# Patient Record
Sex: Male | Born: 1965 | Race: White | Hispanic: No | Marital: Married | Smoking: Current every day smoker
Health system: Southern US, Community
[De-identification: ages and names within clinical notes are randomized; demographics above are authoritative.]

---

## 2015-07-12 DIAGNOSIS — M170 Bilateral primary osteoarthritis of knee: Secondary | ICD-10-CM | POA: Insufficient documentation

## 2016-05-15 ENCOUNTER — Other Ambulatory Visit: Payer: Self-pay | Admitting: Neurology

## 2016-05-15 DIAGNOSIS — M542 Cervicalgia: Secondary | ICD-10-CM

## 2016-06-06 ENCOUNTER — Ambulatory Visit: Admission: RE | Admit: 2016-06-06 | Payer: Managed Care, Other (non HMO) | Source: Ambulatory Visit

## 2016-07-18 ENCOUNTER — Ambulatory Visit: Payer: Managed Care, Other (non HMO)

## 2016-07-21 ENCOUNTER — Ambulatory Visit: Payer: Managed Care, Other (non HMO)

## 2016-08-18 ENCOUNTER — Ambulatory Visit
Admission: RE | Admit: 2016-08-18 | Discharge: 2016-08-18 | Disposition: A | Discharge status: Home / Self Care | Payer: Managed Care, Other (non HMO) | Source: Ambulatory Visit | Attending: Neurology | Admitting: Neurology

## 2016-08-18 DIAGNOSIS — M542 Cervicalgia: Secondary | ICD-10-CM

## 2016-08-18 MED ORDER — GADOBENATE DIMEGLUMINE 529 MG/ML IV SOLN
16.0000 mL | Freq: Once | INTRAVENOUS | Status: AC | PRN
  Administered 2016-08-18: 16 mL via INTRAVENOUS

## 2017-07-17 ENCOUNTER — Ambulatory Visit
Admission: RE | Admit: 2017-07-17 | Discharge: 2017-07-17 | Disposition: A | Discharge status: Home / Self Care | Payer: Managed Care, Other (non HMO) | Source: Ambulatory Visit | Attending: Student in an Organized Health Care Education/Training Program | Admitting: Student in an Organized Health Care Education/Training Program

## 2017-07-17 ENCOUNTER — Ambulatory Visit
Payer: Managed Care, Other (non HMO) | Attending: Student in an Organized Health Care Education/Training Program | Admitting: Student in an Organized Health Care Education/Training Program

## 2017-07-17 ENCOUNTER — Encounter: Payer: Self-pay | Admitting: Student in an Organized Health Care Education/Training Program

## 2017-07-17 VITALS — BP 129/79 | HR 78 | Temp 97.9°F | Resp 16 | Ht 69.0 in | Wt 173.0 lb

## 2017-07-17 DIAGNOSIS — G43909 Migraine, unspecified, not intractable, without status migrainosus: Secondary | ICD-10-CM | POA: Insufficient documentation

## 2017-07-17 DIAGNOSIS — M1712 Unilateral primary osteoarthritis, left knee: Secondary | ICD-10-CM | POA: Insufficient documentation

## 2017-07-17 DIAGNOSIS — Z7982 Long term (current) use of aspirin: Secondary | ICD-10-CM | POA: Diagnosis not present

## 2017-07-17 DIAGNOSIS — G43709 Chronic migraine without aura, not intractable, without status migrainosus: Secondary | ICD-10-CM | POA: Diagnosis not present

## 2017-07-17 DIAGNOSIS — M170 Bilateral primary osteoarthritis of knee: Secondary | ICD-10-CM | POA: Diagnosis not present

## 2017-07-17 DIAGNOSIS — F17210 Nicotine dependence, cigarettes, uncomplicated: Secondary | ICD-10-CM | POA: Diagnosis not present

## 2017-07-17 DIAGNOSIS — M4722 Other spondylosis with radiculopathy, cervical region: Secondary | ICD-10-CM | POA: Diagnosis not present

## 2017-07-17 DIAGNOSIS — Z87442 Personal history of urinary calculi: Secondary | ICD-10-CM | POA: Insufficient documentation

## 2017-07-17 DIAGNOSIS — M7918 Myalgia, other site: Secondary | ICD-10-CM | POA: Insufficient documentation

## 2017-07-17 DIAGNOSIS — M47812 Spondylosis without myelopathy or radiculopathy, cervical region: Secondary | ICD-10-CM

## 2017-07-17 DIAGNOSIS — M5412 Radiculopathy, cervical region: Secondary | ICD-10-CM | POA: Diagnosis not present

## 2017-07-17 DIAGNOSIS — Z79899 Other long term (current) drug therapy: Secondary | ICD-10-CM | POA: Diagnosis not present

## 2017-07-17 DIAGNOSIS — M791 Myalgia: Secondary | ICD-10-CM | POA: Diagnosis not present

## 2017-07-17 MED ORDER — GABAPENTIN 300 MG PO CAPS: 600.0000 mg | ORAL_CAPSULE | Freq: Three times a day (TID) | ORAL | 2 refills | Status: AC

## 2017-07-17 NOTE — Patient Instructions (Signed)
-Increase Gabapentin dose to 600 mg three times a day. (2 capsules, 3 times a day). Rx provided -Bilateral Knee xrays -Follow up in 3-4 weeks to discuss Gabapentin dose increase and procedures for your neck and knees as we dicussed. For your neck we talked about LEFT cervical medial branch blocks (C3-C7) with goal radiofrequency ablation and for your knees we talked about genicular nerve blocks and hyalgen knee injections.   ____________________________________________________________________________________________  Appointment Policy Summary  It is our goal and responsibility to provide the medical community with assistance in the evaluation and management of patients with chronic pain. Unfortunately our resources are limited. Because we do not have an unlimited amount of time, or available appointments, we are required to closely monitor and manage their use. The following rules exist to maximize their use:  Patient's responsibilities: 1. Punctuality:  At what time should I arrive? You should be physically present in our office 30 minutes before your scheduled appointment. Your scheduled appointment is with your assigned healthcare provider. However, it takes 5-10 minutes to be "checked-in", and another 15 minutes for the nurses to do the admission. If you arrive to our office at the time you were given for your appointment, you will end up being at least 20-25 minutes late to your appointment with the provider. 2. Tardiness:  What happens if I arrive only a few minutes after my scheduled appointment time? You will need to reschedule your appointment. The cutoff is your appointment time. This is why it is so important that you arrive at least 30 minutes before that appointment. If you have an appointment scheduled for 10:00 AM and you arrive at 10:01, you will be required to reschedule your appointment.  3. Plan ahead:  Always assume that you will encounter traffic on your way in. Plan for it. If  you are dependent on a driver, make sure they understand these rules and the need to arrive early. 4. Other appointments and responsibilities:  Avoid scheduling any other appointments before or after your pain clinic appointments.  5. Be prepared:  Write down everything that you need to discuss with your healthcare provider and give this information to the admitting nurse. Write down the medications that you will need refilled. Bring your pills and bottles (even the empty ones), to all of your appointments, except for those where a procedure is scheduled. 6. No children or pets:  Find someone to take care of them. It is not appropriate to bring them in. 7. Scheduling changes:  We request "advanced notification" of any changes or cancellations. 8. Advanced notification:  Defined as a time period of more than 24 hours prior to the originally scheduled appointment. This allows for the appointment to be offered to other patients. 9. Rescheduling:  When a visit is rescheduled, it will require the cancellation of the original appointment. For this reason they both fall within the category of "Cancellations".  10. Cancellations:  They require advanced notification. Any cancellation less than 24 hours before the  appointment will be recorded as a "No Show". 11. No Show:  Defined as an unkept appointment where the patient failed to notify or declare to the practice their intention or inability to keep the appointment.  Corrective process for repeat offenders:  1. Tardiness: Three (3) episodes of rescheduling due to late arrivals will be recorded as one (1) "No Show". 2. Cancellation or reschedule: Three (3) cancellations or rescheduling will be recorded as one (1) "No Show". 3. "No Shows": Three (3) "No  Shows" within a 12 month period will result in discharge from the  practice.  ____________________________________________________________________________________________ ____________________________________________________________________________________________  Genicular Nerve Block  What is a genicular nerve block? A genicular nerve block is the injection of a local anesthetic to block the nerves that transmits pain from the knee.  What is the purpose of a facet nerve block? A genicular nerve block is a diagnostic procedure to determine if the pathologic changes (i.e. arthritis, meniscal tears, etc) and inflammation within the knee joint is the source of your knee pain. It also confirms that the knee pain will respond well to the actual treatment procedure. If a genicular nerve block works, it will give you relief for several hours. After that, the pain is expected to return to normal. This test is always performed twice (usually a week or two apart) because two successful tests are required to move onto treatment. If both diagnostic tests are positive, then we schedule a treatment called radiofrequency (RF) ablation. In this procedure, the same nerves are cauterized, which typically leads to pain relief for 4 -18 months. If this process works well for one knee, it can be performed on the other knee if needed.  How is the procedure performed? You will be placed on the procedure table. The injection site is sterilized with either iodine or chlorhexadine. The site to be injected is numbed with a local anesthetic, and a needle is directed to the target area. X-ray guidance is used to ensure proper placement and positioning of the needle. When the needle is properly positioned near the genicular nerve, local anesthetic is injected to numb that nerve. This will be repeated at multiple sites around the knee to block all genicular nerves.  Will the procedure be painful? The injection can be painful and we therefore provide the option of receiving IV sedation. IV  sedation, combined with local anesthetic, can make the injection nearly pain free. It allows you to remain very still during the procedure, which can also make the injection easier, faster, and more successful. If you decide to have IV sedation, you must have a driver to get you home safely afterwards. In addition, you cannot have anything to eat or drink within 8 hours of your appointment (clear liquids are allowed until 3 hours before the procedure). If you take medications for diabetes, these medications may need to be adjusted the morning of the procedure. Your primary care physician can help you with this adjustment.  What are the discharge instructions? If you received IV sedation do not drive or operate machinery for at least 24 hours after the procedure. You may return to work the next day following your procedure. You may resume your normal diet immediately. Do not engage in any strenuous activity for 24 hours. You should, however, engage in moderate activity that typically causes your ususal pain. If the block works, those activities should not be painful for several hours after the injection. Do not take a bath, swim, or use a hot tub for 24 hours (you may take a shower). Call the office if you have any of the following: severe pain afterwards (different than your usual symptoms), redness/swelling/discharge at the injection site(s), fevers/chills, difficulty with bowel or bladder functions.  What are the risks and side effects? The complication rate for this procedure is very low. Whenever a needle enters the skin, bleeding or infection can occur. Some other serious but extremely rare risks include paralysis and death. You may have an allergic reaction to any of  the medications used. If you have a known allergy to any medications, especially local anesthetics, notify our staff before the procedure takes place. You may experience any of the following side effects up to 4 - 6 hours after the  procedure: . Leg muscle weakness or numbness may occur due to the local anesthetic affecting the nerves that control your legs (this is a temporary affect and it is not paralysis). If you have any leg weakness or numbness, walk only with assistance in order to prevent falls and injury. Your leg strength will return slowly and completely. . Dizziness may occur due to a decrease in your blood pressure. If this occurs, remain in a seated or lying position. Gradually sit up, and then stand after at least 10 minutes of sitting. . Mild headaches may occur. Drink fluids and take pain medications if needed. If the headaches persist or become severe, call the office. . Mild discomfort at the injection site can occur. This typically lasts for a few hours but can persist for a couple days. If this occurs, take anti-inflammatories or pain medications, apply ice to the area the day of the procedure. If it persists, apply moist heat in the day(s) following.  The side effects listed above can be normal. They are not dangerous and will resolve on their own. If, however, you experience any of the following, a complication may have occurred and you should either contact your doctor. If he is not readily available, then you should proceed to the closest urgent care center for evaluation: . Severe or progressive pain at the injection site(s) . Arm or leg weakness that progressively worsens or persists for longer than 8 hours . Severe or progressive redness, swelling, or discharge from the injections site(s) . Fevers, chills, nausea, or vomiting . Bowel or bladder dysfunction (i.e. inability to urinate or pass stool or difficulty controlling either)  How long does it take for the procedure to work? You should feel relief from your usual pain within the first hour. Again, this is only expected to last for several hours, at the most. Remember, you may be sore in the middle part of your back from the needles, and you must  distinguish this from your usual pain. ____________________________________________________________________________________________

## 2017-07-17 NOTE — Progress Notes (Signed)
Patient's Name: Andrew Keith  MRN: [id number]  Referring Provider: Vladimir Crofts, MD  DOB: 01-25-1966  PCP: [facility]  DOS: 07/17/2017  Note by: Gillis Santa, MD  Service setting: Ambulatory outpatient  Specialty: Interventional Pain Management  Location: [facility] (AMB) Pain Management Facility  Visit type: Initial Patient Evaluation  Patient type: New Patient   Primary Reason(s) for Visit: Encounter for initial evaluation of one or more chronic problems (new to examiner) potentially causing chronic pain, and posing a threat to normal musculoskeletal function. (Level of risk: High) CC: Neck Pain (bilateral) and Knee Pain (bilateral)  HPI  Andrew Keith is a 51 y.o. year old, male patient, who comes today to see [place] for the first time for an initial evaluation of his chronic pain. He has Primary osteoarthritis of both knees; Cervical radiculopathy; Cervical spondylosis without myelopathy; Myofascial pain; and Migraines on his problem list. Today he comes in for evaluation of his Neck Pain (bilateral) and Knee Pain (bilateral)  Pain Assessment: Location: Left, Right (bilateral) Neck (knees) also endorses headaches that are migraines in nature Radiating: down the arms into hands and fingers,  knee pain stays mainly in the knees Onset: More than a month ago Duration: Chronic pain Quality: Numbness, Tingling, Dull, Discomfort, Constant, Shooting, Aching Severity: 4 /10 (self-reported pain score)  Note: Reported level is compatible with observation.                   Effect on ADL: has affected his grip.  slows him down a little bit but tries to continue with work and daily activities.  end of day is difficult with increased pain. Timing: Constant Modifying factors: ibuprofen helps minimally.    Onset and Duration: Gradual and Present longer than 3 months Cause of pain: Unknown Severity: Getting worse, NAS-11 at its worse: 10/10, NAS-11 at its best: 3/10, NAS-11 now: 4/10 and NAS-11  on the average: 4/10 Timing: Morning, Afternoon, Night, After activity or exercise and After a period of immobility Aggravating Factors: Bending, Kneeling, Prolonged sitting, Squatting and Working Alleviating Factors: nothing Associated Problems: Dizziness, Numbness, Personality changes, Tingling and Pain that wakes patient up Quality of Pain: Aching, Agonizing and Shooting Previous Examinations or Tests: CT scan and MRI scan Previous Treatments: The patient denies treatment  The patient comes into the clinics today for the first time for a chronic pain management evaluation.   Today I took the time to provide the patient with information regarding my pain practice. The patient was informed that my practice is divided into two sections: an interventional pain management section, as well as a completely separate and distinct medication management section. I explained that I have procedure days for my interventional therapies, and evaluation days for follow-ups and medication management. Because of the amount of documentation required during both, they are kept separated. This means that there is the possibility that he may be scheduled for a procedure on one day, and medication management the next. I have also informed him that because of staffing and facility limitations, I no longer take patients for medication management only.  Because interventional pain management is my board-certified specialty, the patient was informed that joining my practice means that they are open to any and all interventional therapies. I made it clear that this does not mean that they will be forced to have any procedures done. What this means is that I believe interventional therapies to be essential part of the diagnosis and proper management of chronic  pain conditions. Therefore, patients not interested in these interventional alternatives will be better served under the care of a different practitioner.  The patient  was also made aware of my Comprehensive Pain Management Safety Guidelines where by joining my practice, they limit all of their nerve blocks and joint injections to those done by our practice, for as long as we are retained to manage their care.   Historic Controlled Substance Pharmacotherapy Review  PMP and historical list of controlled substances: Last prescription for a controlled substance was on 03/31/2014 for Percocet quantity 30.  Medications: The patient did not bring the medication(s) to the appointment, as requested in our "New Patient Package" Pharmacodynamics: Desired effects: Analgesia: The patient reports >50% benefit. Reported improvement in function: The patient reports medication allows him to accomplish basic ADLs. Clinically meaningful improvement in function (CMIF): Sustained CMIF goals met Perceived effectiveness: Described as relatively effective, allowing for increase in activities of daily living (ADL) Undesirable effects: Side-effects or Adverse reactions: None reported Historical Monitoring: The patient  reports that he does not use drugs. List of all UDS Test(s): No results found for: MDMA, COCAINSCRNUR, PCPSCRNUR, PCPQUANT, CANNABQUANT, THCU, Crystal List of all Serum Drug Screening Test(s):  No results found for: AMPHSCRSER, BARBSCRSER, BENZOSCRSER, COCAINSCRSER, PCPSCRSER, PCPQUANT, THCSCRSER, CANNABQUANT, OPIATESCRSER, OXYSCRSER, PROPOXSCRSER Historical Background Evaluation: [place] PDMP: Six (6) year initial data search conducted.             [place] Department of public safety, offender search: Editor, commissioning Information) Non-contributory Risk Assessment Profile: Aberrant behavior: None observed or detected today Risk factors for fatal opioid overdose: age 31-11 years old, history of substance abuse and male gender Fatal overdose hazard ratio (HR): Calculation deferred Non-fatal overdose hazard ratio (HR): Calculation deferred Risk of opioid abuse or dependence: 0.7-3.0% with  doses ? 36 MME/day and 6.1-26% with doses ? 120 MME/day. Substance use disorder (SUD) risk level: Pending results of Medical Psychology Evaluation for SUD Opioid risk tool (ORT) (Total Score): 7  ORT Scoring interpretation table:  Score <3 = Low Risk for SUD  Score between 4-7 = Moderate Risk for SUD  Score >8 = High Risk for Opioid Abuse   PHQ-2 Depression Scale:  Total score: 0  PHQ-2 Scoring interpretation table: (Score and probability of major depressive disorder)  Score 0 = No depression  Score 1 = 15.4% Probability  Score 2 = 21.1% Probability  Score 3 = 38.4% Probability  Score 4 = 45.5% Probability  Score 5 = 56.4% Probability  Score 6 = 78.6% Probability   PHQ-9 Depression Scale:  Total score: 0  PHQ-9 Scoring interpretation table:  Score 0-4 = No depression  Score 5-9 = Mild depression  Score 10-14 = Moderate depression  Score 15-19 = Moderately severe depression  Score 20-27 = Severe depression (2.4 times higher risk of SUD and 2.89 times higher risk of overuse)   Pharmacologic Plan: Adjust therapy: increase Gabapentin to 600 mg TID (from 300 mg BID)            Initial impression: Poor candidate for opioid analgesics. Hx of cocaine abuse in the remote past, +MJ use.  Meds   Current Meds  Medication Sig  . Aspirin-Caffeine (BC FAST PAIN RELIEF ARTHRITIS PO) Take 2 Packages by mouth daily.  Marland Kitchen gabapentin (NEURONTIN) 300 MG capsule Take 2 capsules (600 mg total) by mouth 3 (three) times daily.  Marland Kitchen ibuprofen (ADVIL,MOTRIN) 800 MG tablet Take 800 mg by mouth every 8 (eight) hours as needed (patient usually takes only once  per week.).  . [DISCONTINUED] gabapentin (NEURONTIN) 300 MG capsule Take 1 capsule by mouth 2 (two) times daily.    Imaging Review  Cervical Imaging: Cervical MR w/wo contrast:  Results for orders placed during the hospital encounter of 08/18/16  MR Cervical Spine W Wo Contrast   Narrative CLINICAL DATA:  Chronic neck pain. No known injury.  Initial encounter.  EXAM: MRI CERVICAL SPINE WITHOUT AND WITH CONTRAST  TECHNIQUE: Multiplanar and multiecho pulse sequences of the cervical spine, to include the craniocervical junction and cervicothoracic junction, were obtained according to standard protocol without and with intravenous contrast.  CONTRAST:  16 ml MULTIHANCE GADOBENATE DIMEGLUMINE 529 MG/ML IV SOLN  COMPARISON:  None.  FINDINGS: Vertebral body height and alignment are maintained. No worrisome marrow lesion is seen. Degenerative endplate signal change and associated postcontrast enhancement are seen at C6-7 and C7-T1 eccentric to the right. Small hemangioma in T2 is incidentally noted. The craniocervical junction and cervical cord signal are normal. Imaged paraspinous structures are unremarkable.  C2-3: Mild to moderate facet degenerative change is more notable on the left. No bulge or protrusion. The central canal and foramina are open.  C3-4:  Mild facet degenerative disease.  Otherwise negative.  C4-5: Very shallow disc bulge is seen and there is uncovertebral disease, more notable on the left. Facet degenerative disease is also worse on the left. The central canal is open. Mild to moderate right and moderately severe left foraminal narrowing is identified.  C5-6: Very shallow disc bulge and mild uncovertebral disease. The central canal and foramina are open.  C6-7: Very shallow disc bulge is identified and there is left worse than right uncovertebral disease. The central canal is patent. Mild right and moderately severe left foraminal narrowing is identified.  C7-T1: There is some uncovertebral spurring which is more notable on the right. The central canal and right foramen are widely patent. Moderate right foraminal narrowing is identified.  IMPRESSION: Negative for central canal stenosis. Foraminal narrowing is most notable on the left at C4-5 and C6-7 where it appears moderately severe.  Please see above for descriptions of individual levels.   Electronically Signed   By: Inge Rise M.D.   On: 08/18/2016 10:50     Complexity Note: Imaging results reviewed. Results discussed using Layman's terms.               ROS  Cardiovascular History: No reported cardiovascular signs or symptoms such as High blood pressure, coronary artery disease, abnormal heart rate or rhythm, heart attack, blood thinner therapy or heart weakness and/or failure Pulmonary or Respiratory History: No reported pulmonary signs or symptoms such as wheezing and difficulty taking a deep full breath (Asthma), difficulty blowing air out (Emphysema), coughing up mucus (Bronchitis), persistent dry cough, or temporary stoppage of breathing during sleep Neurological History: No reported neurological signs or symptoms such as seizures, abnormal skin sensations, urinary and/or fecal incontinence, being born with an abnormal open spine and/or a tethered spinal cord Review of Past Neurological Studies: No results found for this or any previous visit. Psychological-Psychiatric History: Psychiatric disorder Gastrointestinal History: No reported gastrointestinal signs or symptoms such as vomiting or evacuating blood, reflux, heartburn, alternating episodes of diarrhea and constipation, inflamed or scarred liver, or pancreas or irrregular and/or infrequent bowel movements Genitourinary History: No reported renal or genitourinary signs or symptoms such as difficulty voiding or producing urine, peeing blood, non-functioning kidney, kidney stones, difficulty emptying the bladder, difficulty controlling the flow of urine, or chronic kidney disease Hematological  History: No reported hematological signs or symptoms such as prolonged bleeding, low or poor functioning platelets, bruising or bleeding easily, hereditary bleeding problems, low energy levels due to low hemoglobin or being anemic Endocrine History: No reported endocrine  signs or symptoms such as high or low blood sugar, rapid heart rate due to high thyroid levels, obesity or weight gain due to slow thyroid or thyroid disease Rheumatologic History: Joint aches and or swelling due to excess weight (Osteoarthritis) Musculoskeletal History: Negative for myasthenia gravis, muscular dystrophy, multiple sclerosis or malignant hyperthermia Work History: Working full time  Allergies  Mr. Nabers has No Known Allergies.  Laboratory Chemistry  Inflammation Markers (CRP: Acute Phase) (ESR: Chronic Phase) No results found for: CRP, ESRSEDRATE               Renal Function Markers No results found for: BUN, CREATININE, GFRAA, GFRNONAA               Hepatic Function Markers No results found for: AST, ALT, ALBUMIN, ALKPHOS, HCVAB               Electrolytes No results found for: NA, K, CL, CALCIUM, MG               Neuropathy Markers No results found for: [id number]               Bone Pathology Markers No results found for: Hendricks Milo, VD125OH2TOT, [id number], [id number], 25OHVITD1, 25OHVITD2, 25OHVITD3, CALCIUM, TESTOFREE, TESTOSTERONE               Coagulation Parameters No results found for: INR, LABPROT, APTT, PLT               Cardiovascular Markers No results found for: BNP, HGB, HCT               Note: Lab results reviewed.  PFSH  Drug: Mr. Burrows  reports that he does not use drugs. remote history of cocaine abuse, + MJ use Alcohol:  reports that he drinks alcohol. Tobacco:  reports that he has been smoking.  He has been smoking about 0.50 packs per day. He has never used smokeless tobacco. Medical:  has no past medical history on file. Family: family history is not on file.  No past surgical history on file. Active Ambulatory Problems    Diagnosis Date Noted  . Primary osteoarthritis of both knees 07/12/2015  . Cervical radiculopathy 07/17/2017  . Cervical spondylosis without myelopathy 07/17/2017  . Myofascial pain 07/17/2017  . Migraines  07/17/2017   Resolved Ambulatory Problems    Diagnosis Date Noted  . No Resolved Ambulatory Problems   No Additional Past Medical History   Constitutional Exam  General appearance: Well nourished, well developed, and well hydrated. In no apparent acute distress Vitals:   07/17/17 1348  BP: 129/79  Pulse: 78  Resp: 16  Temp: 97.9 F (36.6 C)  TempSrc: Oral  SpO2: 100%  Weight: 173 lb (78.5 kg)  Height: 5' 9"  (1.753 m)   BMI Assessment: Estimated body mass index is 25.55 kg/m as calculated from the following:   Height as of this encounter: 5' 9"  (1.753 m).   Weight as of this encounter: 173 lb (78.5 kg).  BMI interpretation table: BMI level Category Range association with higher incidence of chronic pain  <18 kg/m2 Underweight   18.5-24.9 kg/m2 Ideal body weight   25-29.9 kg/m2 Overweight Increased incidence by 20%  30-34.9 kg/m2 Obese (Class I) Increased incidence by 68%  35-39.9 kg/m2  Severe obesity (Class II) Increased incidence by 136%  >40 kg/m2 Extreme obesity (Class III) Increased incidence by 254%   BMI Readings from Last 4 Encounters:  07/17/17 25.55 kg/m   Wt Readings from Last 4 Encounters:  07/17/17 173 lb (78.5 kg)  Psych/Mental status: Alert, oriented x 3 (person, place, & time)       Eyes: PERLA Respiratory: No evidence of acute respiratory distress  Cervical Spine Exam  Inspection: No masses, redness, or swelling Alignment: Symmetrical Functional ROM: Pain restricted ROM     pain with left head rotation and extension Stability: No instability detected Muscle strength & Tone: Functionally intact Sensory: Movement-associated pain and discomfort Palpation: Complains of area being tender to palpation along the upper cervical facets, L>R              Upper Extremity (UE) Exam    Side: Right upper extremity  Side: Left upper extremity  Inspection: No masses, redness, swelling, or asymmetry. No contractures  Inspection: No masses, redness, swelling, or  asymmetry. No contractures  Functional ROM: Unrestricted ROM          Functional ROM: Unrestricted ROM          Muscle strength & Tone: Normal strength (5/5)  Muscle strength & Tone: Normal strength (5/5)  Sensory: Unimpaired  Sensory: Unimpaired  Palpation: No palpable anomalies              Palpation: No palpable anomalies              Specialized Test(s): Deferred         Specialized Test(s): Deferred          Thoracic Spine Exam  Inspection: No masses, redness, or swelling Alignment: Symmetrical Functional ROM: Unrestricted ROM Stability: No instability detected Sensory: Unimpaired Muscle strength & Tone: No palpable anomalies  Lumbar Spine Exam  Inspection: No masses, redness, or swelling Alignment: Symmetrical Functional ROM: Unrestricted ROM      Stability: No instability detected Muscle strength & Tone: Functionally intact Sensory: Unimpaired Palpation: No palpable anomalies       Provocative Tests: Lumbar Hyperextension and rotation test: Negative       Lumbar Lateral bending test: Negative       Patrick's Maneuver: Negative                    Gait & Posture Assessment  Ambulation: Unassisted Gait: Relatively normal for age and body habitus Posture: WNL   Lower Extremity Exam    Side: Right lower extremity  Side: Left lower extremity  Inspection: No masses, redness, swelling, or asymmetry. No contractures  Inspection: No masses, redness, swelling, or asymmetry. No contractures  Functional ROM: Unrestricted ROM          Functional ROM: Unrestricted ROM          Muscle strength & Tone: Functionally intact  Muscle strength & Tone: Functionally intact  Sensory: Unimpaired  Sensory: Unimpaired  Palpation: Complains of area being tender to palpation (knee- along patella and tibial tuberosity)  Palpation: No palpable anomalies  (knee- along patella and tibial tuberosity)   Assessment  Primary Diagnosis & Pertinent Problem List: The primary encounter diagnosis was Primary  osteoarthritis of left knee. Diagnoses of Cervical radiculopathy, Cervical spondylosis without myelopathy, Myofascial pain, Chronic migraine without aura without status migrainosus, not intractable, and Primary osteoarthritis of both knees were also pertinent to this visit.  Visit Diagnosis (New problems to examiner): 1. Primary osteoarthritis of left knee   2.  Cervical radiculopathy   3. Cervical spondylosis without myelopathy   4. Myofascial pain   5. Chronic migraine without aura without status migrainosus, not intractable   6. Primary osteoarthritis of both knees    Plan of Care (Initial workup plan)  Note: Please be advised that as per protocol, today's visit has been an evaluation only. We have not taken over the patient's controlled substance management.  Patient is a 51 year old male with a past medical history consistent with migraine headaches, peripheral neuropathy, neck pain, cervicalgia. Patient does have a history of alcohol abuse and marijuana use in the past. Patient's neck pain and associated shoulder pain could also be secondary to cervical facet arthropathy, cervical spondylosis, chronic cervical radiculopathy. Patient's MRI from August 2017 shows left C4, C5 and C6, C7 neural foraminal narrowing. MRI negative for central canal stenosis.  Patient's headaches could be secondary to cervical myofascial pain syndrome as well as cervical facet arthropathy. Regarding the patient's neck pain, we discussed cervical medial branch nerve blocks with goal radiofrequency ablation, trigger point injections, Botox therapy.  Patient has bilateral knee pain as well likely secondary to knee osteoarthritis. Patient was told that he needs knee replacement surgery given his age, it is better that surgery be deferred and conservative management be undertaken. We discussed obtaining bilateral knee films and then considering either intra-articular Hyalgan injections versus genicular nerve blocks.  In  regards to medication therapy, patient is taking Gabapentin 300 mg BID and Ibuprofen 800 mg q8 prn which he finds moderately effective. Of note patient does have a history of cocaine useIn the 1990s but has not abused cocaine recently. Patient does endorse marijuana use, most recently as of the last week.  Of note, patient will NOT be a candidate for opioid therapy at our clinic given hx of prior cocaine and ETOH abuse along with active, current MJ use.  Plan: #1. Increase gabapentin to 600 mg 3 times a day. Prescription provided. #2. We will obtain x-rays of bilateral knees to evaluate disease progression of osteoarthritis. #3. We had a discussion about cervical medial branch nerve blocks on the left targeting left C3, C4, C5, C6 with goal radiofrequency ablation. #4. For the patient's knee pain, we discussed intra-articular Hyalgan injections bilaterally and also genicular nerve blocks. #5. Follow-up in 3-4 weeks.    Ordered Lab-work, Procedure(s), Referral(s), & Consult(s): Orders Placed This Encounter  Procedures  . DG Knee 1-2 Views Left  . DG Knee 1-2 Views Right   Pharmacotherapy (current): Medications ordered:  Meds ordered this encounter  Medications  . gabapentin (NEURONTIN) 300 MG capsule    Sig: Take 2 capsules (600 mg total) by mouth 3 (three) times daily.    Dispense:  180 capsule    Refill:  2   Medications administered during this visit: Mr. Witter had no medications administered during this visit.   Pharmacological management options:  Opioid Analgesics: The patient was informed that there is no guarantee that he would be a candidate for opioid analgesics. The decision will be made following CDC guidelines. This decision will be based on the results of diagnostic studies, as well as Mr. Wilczynski risk profile.   Membrane stabilizer: Patient is currently on gabapentin (300 mg twice a day and (, dose increased to 600 mg 3 times a day today. Patient has not tried Lyrica, Topamax   Muscle relaxant: Baclofen, tizanidine, Robaxin, Flexeril. This could be beneficial for his neck pain  NSAID: Takes ibuprofen 800 mg 3 times a day when necessary.  Can consider short trial of Mobic in the future.  Other analgesic(s): Can consider SNRI, TCA    Interventional management options: Mr. Lamountain was informed that there is no guarantee that he would be a candidate for interventional therapies. The decision will be based on the results of diagnostic studies, as well as Mr. Brow risk profile. Occasional Procedure(s) under consideration:   #1. Left C3-C4 C5-C6 medial branch nerve block with goal radiofrequency ablation. #2.  bilateral genicular nerve blocks with goal radiofrequency ablation  #3. Bilateral intra-articular Hyalgan injections. #4. Trigger point injections of cervical paraspinals and trapezius muscles #5. Cervical epidural steroid injection #6. Botox injection for migraine     Provider-requested follow-up: Return in about 4 weeks (around 08/14/2017).  Future Appointments Date Time Provider [place]  07/17/2017 3:15 PM [facility]    Primary Care Physician: [facility] Location: [facility] Note by: Gillis Santa, M.D, Date: 07/17/2017; Time: 3:11 PM  Patient Instructions  -Increase Gabapentin dose to 600 mg three times a day. (2 capsules, 3 times a day). Rx provided -Bilateral Knee xrays -Follow up in 3-4 weeks to discuss Gabapentin dose increase and procedures for your neck and knees as we dicussed. For your neck we talked about LEFT cervical medial branch blocks (C3-C7) with goal radiofrequency ablation and for your knees we talked about genicular nerve blocks and hyalgen knee injections.   ____________________________________________________________________________________________  Appointment Policy Summary  It is our goal and responsibility to provide the medical community with assistance  in the evaluation and management of patients with chronic pain. Unfortunately our resources are limited. Because we do not have an unlimited amount of time, or available appointments, we are required to closely monitor and manage their use. The following rules exist to maximize their use:  Patient's responsibilities: 1. Punctuality:  At what time should I arrive? You should be physically present in our office 30 minutes before your scheduled appointment. Your scheduled appointment is with your assigned healthcare provider. However, it takes 5-10 minutes to be "checked-in", and another 15 minutes for the nurses to do the admission. If you arrive to our office at the time you were given for your appointment, you will end up being at least 20-25 minutes late to your appointment with the provider. 2. Tardiness:  What happens if I arrive only a few minutes after my scheduled appointment time? You will need to reschedule your appointment. The cutoff is your appointment time. This is why it is so important that you arrive at least 30 minutes before that appointment. If you have an appointment scheduled for 10:00 AM and you arrive at 10:01, you will be required to reschedule your appointment.  3. Plan ahead:  Always assume that you will encounter traffic on your way in. Plan for it. If you are dependent on a driver, make sure they understand these rules and the need to arrive early. 4. Other appointments and responsibilities:  Avoid scheduling any other appointments before or after your pain clinic appointments.  5. Be prepared:  Write down everything that you need to discuss with your healthcare provider and give this information to the admitting nurse. Write down the medications that you will need refilled. Bring your pills and bottles (even the empty ones), to all of your appointments, except for those where a procedure is scheduled. 6. No children or pets:  Find someone to take care of them. It is not  appropriate to bring them in. 7.  Scheduling changes:  We request "advanced notification" of any changes or cancellations. 8. Advanced notification:  Defined as a time period of more than 24 hours prior to the originally scheduled appointment. This allows for the appointment to be offered to other patients. 9. Rescheduling:  When a visit is rescheduled, it will require the cancellation of the original appointment. For this reason they both fall within the category of "Cancellations".  10. Cancellations:  They require advanced notification. Any cancellation less than 24 hours before the  appointment will be recorded as a "No Show". 11. No Show:  Defined as an unkept appointment where the patient failed to notify or declare to the practice their intention or inability to keep the appointment.  Corrective process for repeat offenders:  1. Tardiness: Three (3) episodes of rescheduling due to late arrivals will be recorded as one (1) "No Show". 2. Cancellation or reschedule: Three (3) cancellations or rescheduling will be recorded as one (1) "No Show". 3. "No Shows": Three (3) "No Shows" within a 12 month period will result in discharge from the practice.  ____________________________________________________________________________________________ ____________________________________________________________________________________________  Genicular Nerve Block  What is a genicular nerve block? A genicular nerve block is the injection of a local anesthetic to block the nerves that transmits pain from the knee.  What is the purpose of a facet nerve block? A genicular nerve block is a diagnostic procedure to determine if the pathologic changes (i.e. arthritis, meniscal tears, etc) and inflammation within the knee joint is the source of your knee pain. It also confirms that the knee pain will respond well to the actual treatment procedure. If a genicular nerve block works, it will give you relief  for several hours. After that, the pain is expected to return to normal. This test is always performed twice (usually a week or two apart) because two successful tests are required to move onto treatment. If both diagnostic tests are positive, then we schedule a treatment called radiofrequency (RF) ablation. In this procedure, the same nerves are cauterized, which typically leads to pain relief for 4 -18 months. If this process works well for one knee, it can be performed on the other knee if needed.  How is the procedure performed? You will be placed on the procedure table. The injection site is sterilized with either iodine or chlorhexadine. The site to be injected is numbed with a local anesthetic, and a needle is directed to the target area. X-ray guidance is used to ensure proper placement and positioning of the needle. When the needle is properly positioned near the genicular nerve, local anesthetic is injected to numb that nerve. This will be repeated at multiple sites around the knee to block all genicular nerves.  Will the procedure be painful? The injection can be painful and we therefore provide the option of receiving IV sedation. IV sedation, combined with local anesthetic, can make the injection nearly pain free. It allows you to remain very still during the procedure, which can also make the injection easier, faster, and more successful. If you decide to have IV sedation, you must have a driver to get you home safely afterwards. In addition, you cannot have anything to eat or drink within 8 hours of your appointment (clear liquids are allowed until 3 hours before the procedure). If you take medications for diabetes, these medications may need to be adjusted the morning of the procedure. Your primary care physician can help you with this adjustment.  What are the discharge instructions? If  you received IV sedation do not drive or operate machinery for at least 24 hours after the procedure. You  may return to work the next day following your procedure. You may resume your normal diet immediately. Do not engage in any strenuous activity for 24 hours. You should, however, engage in moderate activity that typically causes your ususal pain. If the block works, those activities should not be painful for several hours after the injection. Do not take a bath, swim, or use a hot tub for 24 hours (you may take a shower). Call the office if you have any of the following: severe pain afterwards (different than your usual symptoms), redness/swelling/discharge at the injection site(s), fevers/chills, difficulty with bowel or bladder functions.  What are the risks and side effects? The complication rate for this procedure is very low. Whenever a needle enters the skin, bleeding or infection can occur. Some other serious but extremely rare risks include paralysis and death. You may have an allergic reaction to any of the medications used. If you have a known allergy to any medications, especially local anesthetics, notify our staff before the procedure takes place. You may experience any of the following side effects up to 4 - 6 hours after the procedure: . Leg muscle weakness or numbness may occur due to the local anesthetic affecting the nerves that control your legs (this is a temporary affect and it is not paralysis). If you have any leg weakness or numbness, walk only with assistance in order to prevent falls and injury. Your leg strength will return slowly and completely. . Dizziness may occur due to a decrease in your blood pressure. If this occurs, remain in a seated or lying position. Gradually sit up, and then stand after at least 10 minutes of sitting. . Mild headaches may occur. Drink fluids and take pain medications if needed. If the headaches persist or become severe, call the office. . Mild discomfort at the injection site can occur. This typically lasts for a few hours but can persist for a couple  days. If this occurs, take anti-inflammatories or pain medications, apply ice to the area the day of the procedure. If it persists, apply moist heat in the day(s) following.  The side effects listed above can be normal. They are not dangerous and will resolve on their own. If, however, you experience any of the following, a complication may have occurred and you should either contact your doctor. If he is not readily available, then you should proceed to the closest urgent care center for evaluation: . Severe or progressive pain at the injection site(s) . Arm or leg weakness that progressively worsens or persists for longer than 8 hours . Severe or progressive redness, swelling, or discharge from the injections site(s) . Fevers, chills, nausea, or vomiting . Bowel or bladder dysfunction (i.e. inability to urinate or pass stool or difficulty controlling either)  How long does it take for the procedure to work? You should feel relief from your usual pain within the first hour. Again, this is only expected to last for several hours, at the most. Remember, you may be sore in the middle part of your back from the needles, and you must distinguish this from your usual pain. ____________________________________________________________________________________________

## 2018-06-04 IMAGING — CR DG KNEE 1-2V*L*
1 series · 2 of 2 positions shown · non-contrast
Comparison: None

CLINICAL DATA: Chronic pain, BILATERAL osteoarthritis of the knees

EXAM:
LEFT KNEE - 1-2 VIEW

[Series 1: dg knee 1-2 views left · 0.14mm/px · 2 of 2 slices shown]
[im 1/2]
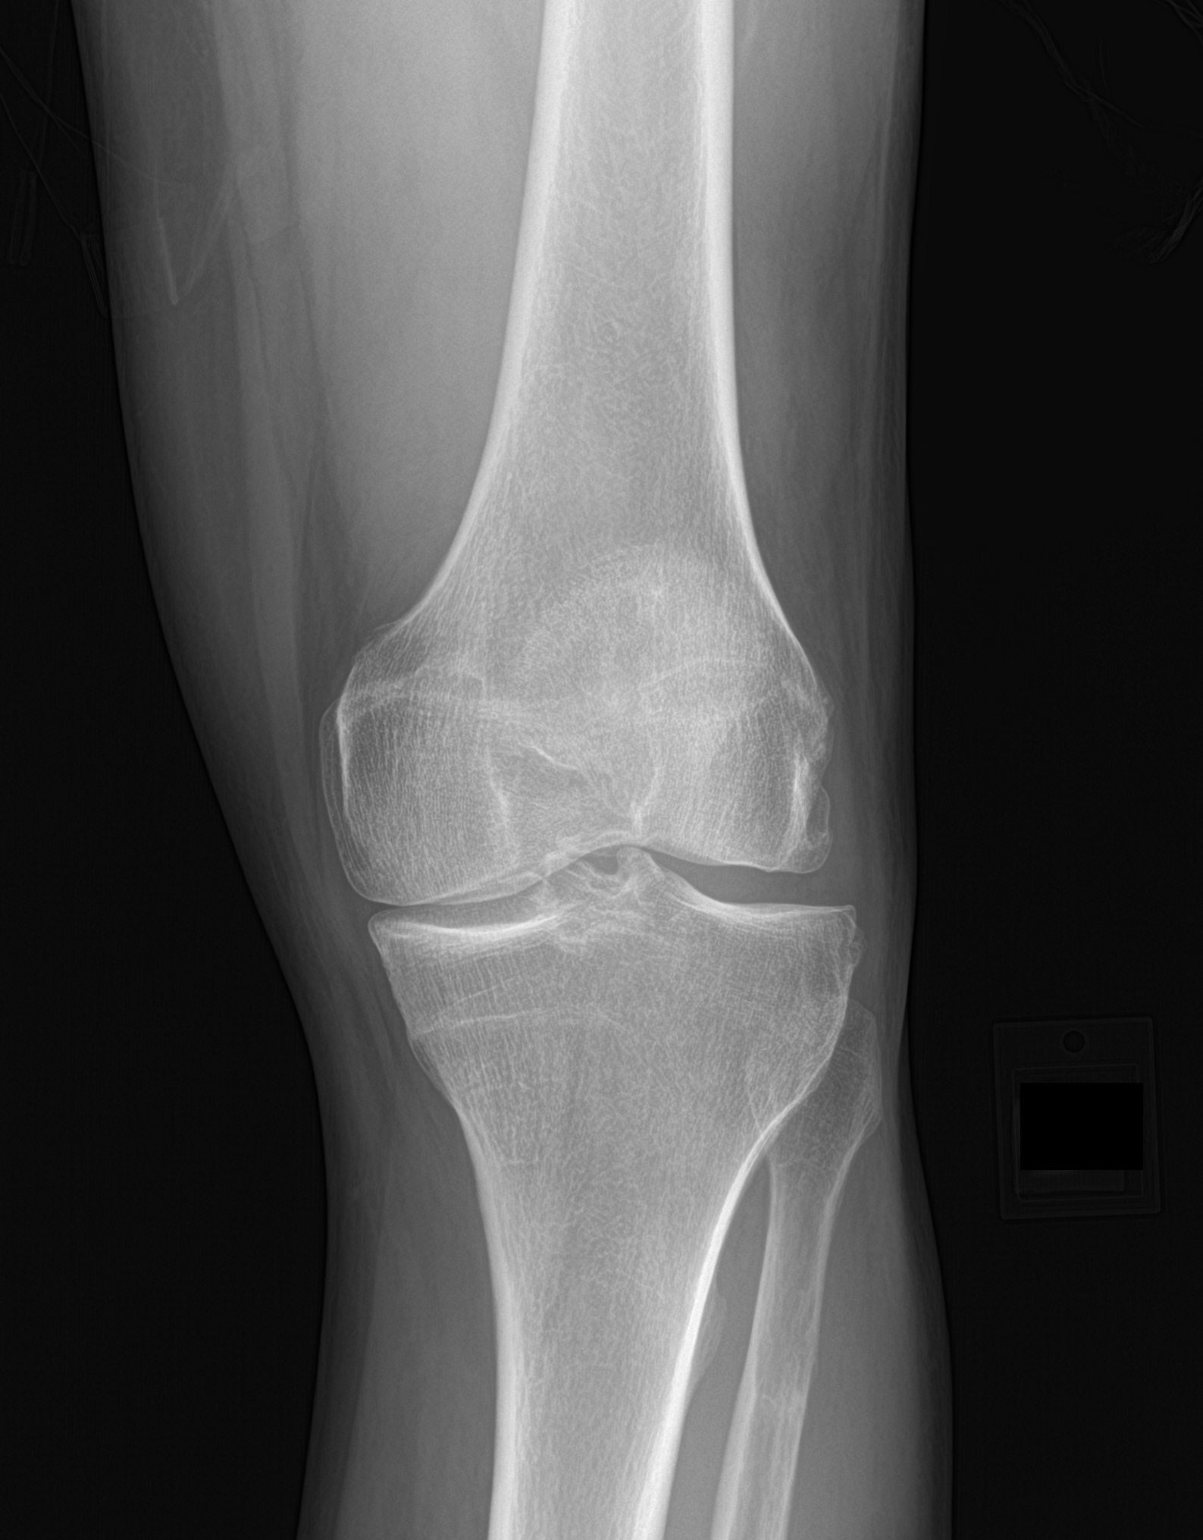
[im 2/2]
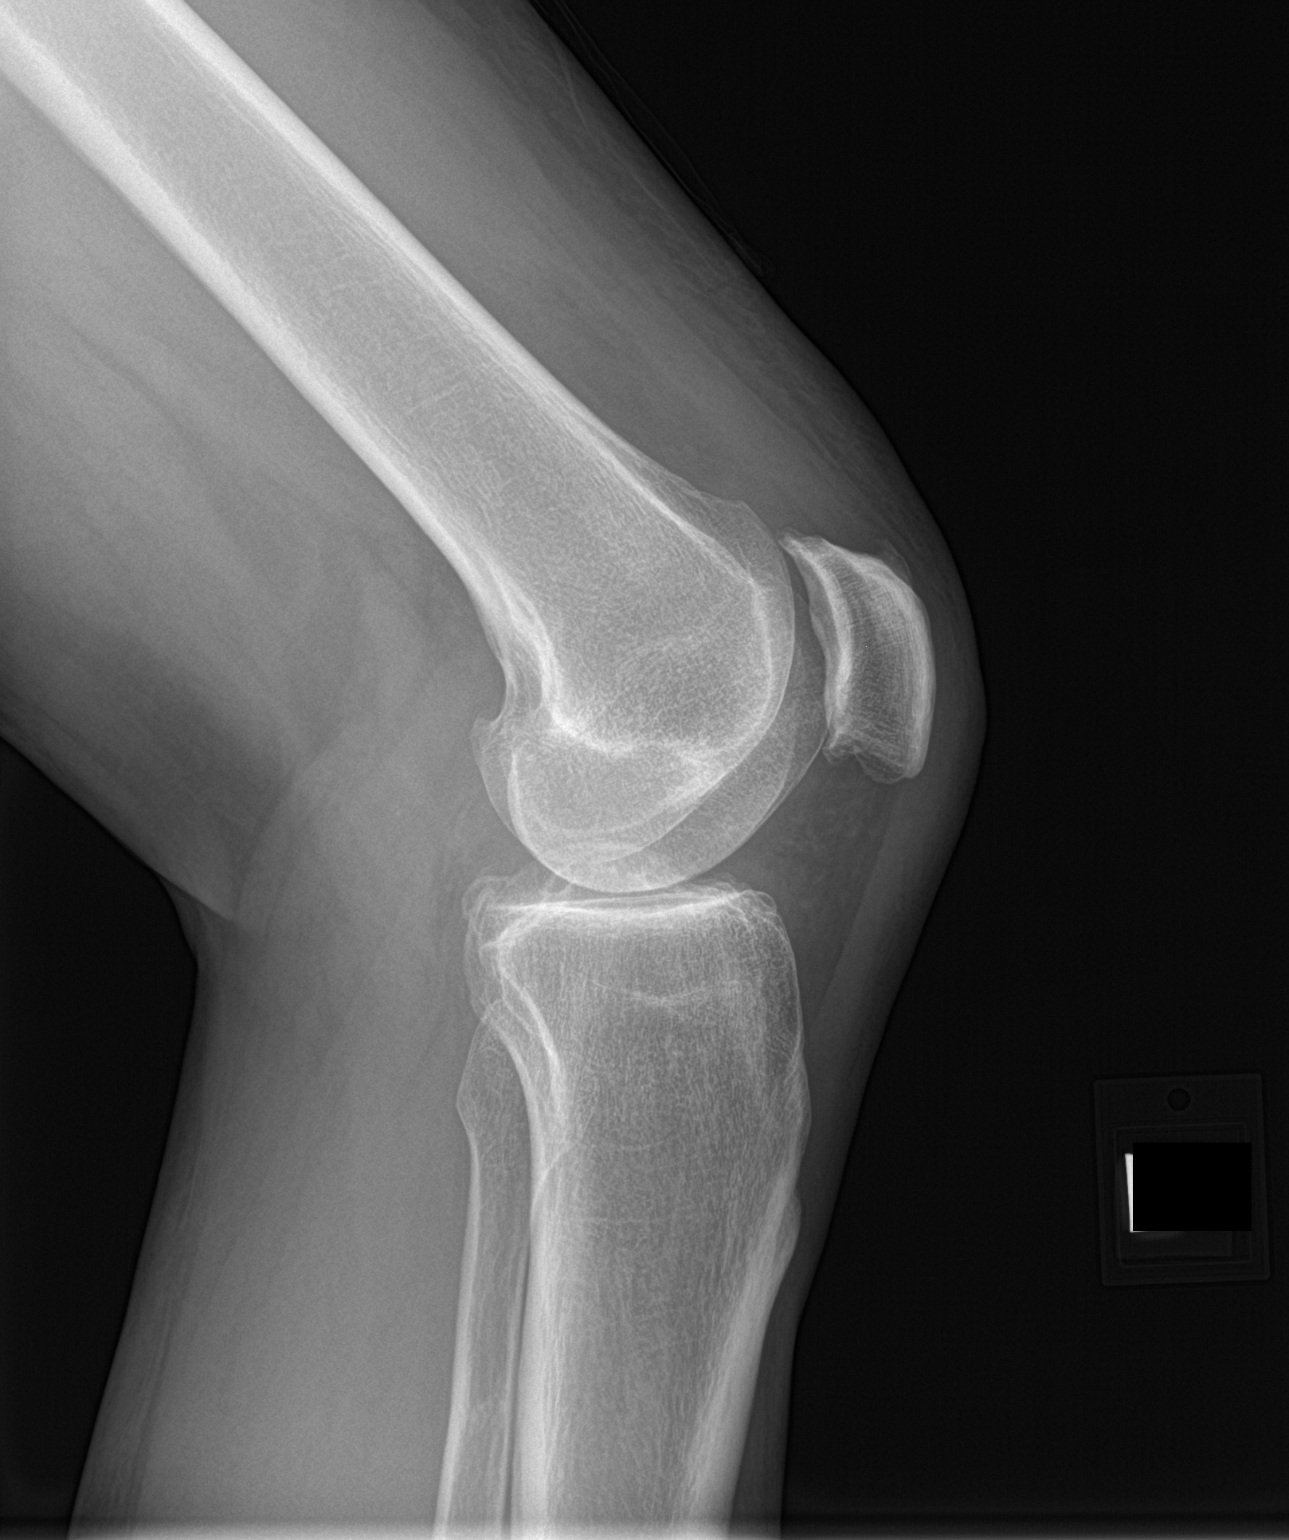

[2 of 2 positions shown; findings below may reference images not displayed]

FINDINGS: Mild osseous demineralization.

Scattered joint space narrowing and spur formation.

No acute fracture, dislocation, or bone destruction.

No knee joint effusion.
IMPRESSION: Osteoarthritic changes LEFT knee.

## 2022-09-25 ENCOUNTER — Other Ambulatory Visit: Payer: Self-pay | Admitting: Neurology

## 2022-09-25 DIAGNOSIS — M5412 Radiculopathy, cervical region: Secondary | ICD-10-CM

## 2022-09-25 DIAGNOSIS — M542 Cervicalgia: Secondary | ICD-10-CM
